# Patient Record
Sex: Female | Born: 1940 | Race: White | Hispanic: No | Marital: Married | Smoking: Never smoker
Health system: Southern US, Community
[De-identification: ages and names within clinical notes are randomized; demographics above are authoritative.]

## PROBLEM LIST (undated history)

## (undated) DIAGNOSIS — J45909 Unspecified asthma, uncomplicated: Secondary | ICD-10-CM

## (undated) DIAGNOSIS — I10 Essential (primary) hypertension: Secondary | ICD-10-CM

## (undated) DIAGNOSIS — E785 Hyperlipidemia, unspecified: Secondary | ICD-10-CM

## (undated) DIAGNOSIS — K219 Gastro-esophageal reflux disease without esophagitis: Secondary | ICD-10-CM

## (undated) HISTORY — DX: Essential (primary) hypertension: I10

## (undated) HISTORY — DX: Hyperlipidemia, unspecified: E78.5

## (undated) HISTORY — PX: TONSILLECTOMY: SUR1361

## (undated) HISTORY — DX: Unspecified asthma, uncomplicated: J45.909

## (undated) HISTORY — PX: ABDOMINAL HYSTERECTOMY: SHX81

## (undated) HISTORY — DX: Gastro-esophageal reflux disease without esophagitis: K21.9

## (undated) HISTORY — PX: DG GALL BLADDER: HXRAD326

---

## 2015-10-19 ENCOUNTER — Telehealth: Payer: Self-pay | Admitting: Nurse Practitioner

## 2015-10-19 NOTE — Telephone Encounter (Signed)
Appt given per patients request 

## 2015-10-25 ENCOUNTER — Encounter: Payer: Self-pay | Admitting: Nurse Practitioner

## 2015-10-25 ENCOUNTER — Ambulatory Visit (INDEPENDENT_AMBULATORY_CARE_PROVIDER_SITE_OTHER): Payer: Medicare Other | Admitting: Nurse Practitioner

## 2015-10-25 VITALS — BP 118/70 | HR 69 | Temp 97.6°F | Ht 60.0 in | Wt 271.0 lb

## 2015-10-25 DIAGNOSIS — J4520 Mild intermittent asthma, uncomplicated: Secondary | ICD-10-CM | POA: Diagnosis not present

## 2015-10-25 DIAGNOSIS — E785 Hyperlipidemia, unspecified: Secondary | ICD-10-CM

## 2015-10-25 DIAGNOSIS — I2584 Coronary atherosclerosis due to calcified coronary lesion: Secondary | ICD-10-CM

## 2015-10-25 DIAGNOSIS — I10 Essential (primary) hypertension: Secondary | ICD-10-CM | POA: Insufficient documentation

## 2015-10-25 DIAGNOSIS — M159 Polyosteoarthritis, unspecified: Secondary | ICD-10-CM

## 2015-10-25 DIAGNOSIS — K219 Gastro-esophageal reflux disease without esophagitis: Secondary | ICD-10-CM | POA: Insufficient documentation

## 2015-10-25 DIAGNOSIS — M150 Primary generalized (osteo)arthritis: Secondary | ICD-10-CM

## 2015-10-25 DIAGNOSIS — M810 Age-related osteoporosis without current pathological fracture: Secondary | ICD-10-CM | POA: Diagnosis not present

## 2015-10-25 DIAGNOSIS — D649 Anemia, unspecified: Secondary | ICD-10-CM | POA: Diagnosis not present

## 2015-10-25 DIAGNOSIS — L03115 Cellulitis of right lower limb: Secondary | ICD-10-CM

## 2015-10-25 DIAGNOSIS — G609 Hereditary and idiopathic neuropathy, unspecified: Secondary | ICD-10-CM | POA: Diagnosis not present

## 2015-10-25 DIAGNOSIS — I2510 Atherosclerotic heart disease of native coronary artery without angina pectoris: Secondary | ICD-10-CM | POA: Diagnosis not present

## 2015-10-25 MED ORDER — CIPROFLOXACIN HCL 500 MG PO TABS: 500.0000 mg | ORAL_TABLET | Freq: Two times a day (BID) | ORAL | 0 refills | Status: DC

## 2015-10-25 MED ORDER — GABAPENTIN 300 MG PO CAPS: 300.0000 mg | ORAL_CAPSULE | Freq: Every day | ORAL | 3 refills | Status: DC

## 2015-10-25 NOTE — Addendum Note (Signed)
Addended by: Bennie PieriniMARTIN, MARY-MARGARET on: 10/25/2015 11:44 AM   Modules accepted: Orders

## 2015-10-25 NOTE — Progress Notes (Addendum)
Subjective:    Patient ID: Maureen Long, female    DOB: 06-27-1940, 75 y.o.   MRN: [id number]  Patient here today to establish care.  Outpatient Encounter Prescriptions as of 10/25/2015  Medication Sig  . albuterol (PROVENTIL HFA;VENTOLIN HFA) 108 (90 Base) MCG/ACT inhaler Inhale 2 puffs into the lungs every 4 (four) hours as needed for wheezing or shortness of breath.  Marland Kitchen. alendronate (FOSAMAX) 35 MG tablet   . aspirin EC 81 MG tablet Take 81 mg by mouth daily.  . beclomethasone (QVAR) 80 MCG/ACT inhaler Inhale 2 puffs into the lungs 2 (two) times daily.  . celecoxib (CELEBREX) 200 MG capsule Take 200 mg by mouth 2 (two) times daily.  . ferrous sulfate 325 (65 FE) MG tablet Take 325 mg by mouth daily with breakfast.  . irbesartan (AVAPRO) 150 MG tablet Take 150 mg by mouth daily.  . isosorbide dinitrate (ISORDIL) 30 MG tablet Take 30 mg by mouth daily.  . montelukast (SINGULAIR) 10 MG tablet Take 10 mg by mouth at bedtime.  . Multiple Vitamin (MULTIVITAMIN WITH MINERALS) TABS tablet Take 1 tablet by mouth daily.  . Omega-3 Fatty Acids (FISH OIL) 1000 MG CAPS Take by mouth.  Marland Kitchen. omeprazole (PRILOSEC) 40 MG capsule   . rosuvastatin (CRESTOR) 40 MG tablet Take 40 mg by mouth daily.  Marland Kitchen. triamterene-hydrochlorothiazide (MAXZIDE-25) 37.5-25 MG tablet Take 1 tablet by mouth daily. Take 1/2 tablet daily as needed  . albuterol (PROVENTIL) (2.5 MG/3ML) 0.083% nebulizer solution Take 2.5 mg by nebulization every 6 (six) hours as needed for wheezing or shortness of breath.  Marland Kitchen. ipratropium (ATROVENT) 0.02 % nebulizer solution Take 0.5 mg by nebulization every 6 (six) hours as needed for wheezing or shortness of breath.   No facility-administered encounter medications on file as of 10/25/2015.     Hypertension  This is a chronic problem. The current episode started more than 1 year ago. The problem is unchanged. The problem is controlled. Associated symptoms include peripheral edema and shortness of breath  (on occasion). Pertinent negatives include no headaches or neck pain. Risk factors for coronary artery disease include obesity, post-menopausal state, sedentary lifestyle and dyslipidemia. Past treatments include angiotensin blockers. The current treatment provides moderate improvement. Compliance problems include diet.  Hypertensive end-organ damage includes CAD/MI.  Hyperlipidemia  This is a chronic problem. The current episode started more than 1 year ago. The problem is controlled. Recent lipid tests were reviewed and are variable. Exacerbating diseases include obesity. She has no history of diabetes or hypothyroidism. Associated symptoms include shortness of breath (on occasion). Current antihyperlipidemic treatment includes statins. The current treatment provides mild improvement of lipids. Compliance problems include adherence to diet and adherence to exercise.  Risk factors for coronary artery disease include dyslipidemia, obesity and post-menopausal.  CAD Sees cardiologist- Dr. Gwenlyn Long- doing well- is currently on issosorbide- she denies any chest pain but does have occasional SOB- sees cardiology yearly. GERD Omeprazole for gastric reflux- works well to keep symptoms under control Anemia Ferrous sulfate daily- always fatigue Asthma Currently on qvar daily and uses albuterol only as needed. Sees pulmonologist yearly. Still has occasional SOB. osteoporosis Fosamax weekly- no side effects- no c/o back pain osteoarthritis All over- says different joints flare up at different times- takes celebrex daily- seems to really help.  * Patient had a lesion burned offof her right lower leg last week and area is red and sore. * Patient has numbness in tingling in legs and feet- this has been going on  for awhile. She has frequent flare ups of sciatica.  HAD BLOOD WORK DONE 1 MONTH AGO- WAS ALL GOOD EXCEPT BLOOD WORK WAS ELEVATED.  Review of Systems  Constitutional: Negative.   HENT: Negative.     Respiratory: Positive for shortness of breath (on occasion).   Cardiovascular: Negative.   Gastrointestinal: Negative.   Genitourinary: Negative.   Musculoskeletal: Negative for neck pain.  Neurological: Negative.  Negative for headaches.  Psychiatric/Behavioral: Negative.   All other systems reviewed and are negative.      Objective:   Physical Exam  Constitutional: She is oriented to person, place, and time. She appears well-developed and well-nourished.  HENT:  Nose: Nose normal.  Mouth/Throat: Oropharynx is clear and moist.  Eyes: EOM are normal.  Neck: Trachea normal, normal range of motion and full passive range of motion without pain. Neck supple. No JVD present. Carotid bruit is not present. No thyromegaly present.  Cardiovascular: Normal rate, regular rhythm and intact distal pulses.  Exam reveals no gallop and no friction rub.   Murmur (2/6 systolic murmur) heard. Pulmonary/Chest: Effort normal and breath sounds normal.  Abdominal: Soft. Bowel sounds are normal. She exhibits no distension and no mass. There is no tenderness.  Musculoskeletal: Normal range of motion.  Lymphadenopathy:    She has no cervical adenopathy.  Neurological: She is alert and oriented to person, place, and time. She has normal reflexes.  Skin: Skin is warm and dry.  3CM ANNULAR RAW MOIST AREA WITH SURROUNDING ERYTHEMA RIGHT LOWER LEG  Psychiatric: She has a normal mood and affect. Her behavior is normal. Judgment and thought content normal.    BP 118/70   Pulse 69   Temp 97.6 F (36.4 C) (Oral)   Ht 5' (1.524 m)   Wt 271 lb (122.9 kg)   BMI 52.93 kg/m       Assessment & Plan:  1. Essential hypertension Do not add salt to diet  2. Hyperlipidemia Low fat diet  3. Gastroesophageal reflux disease without esophagitis Avoid spicy foods Do not eat 2 hours prior to bedtime  4. Coronary artery disease due to calcified coronary lesion follow up with cardiology  5. Osteoporosis Weight  bearing exercises as tolerated  6. Asthma, mild intermittent, uncomplicated Keep follow up with pulmonology  7. Anemia, unspecified anemia type  8. Morbid obesity, unspecified obesity type (HCC) Discussed diet and exercise for person with BMI >25 Will recheck weight in 3-6 months  9. Primary osteoarthritis involving multiple joints  10. Hereditary and idiopathic peripheral neuropathy Do not go barfooted - gabapentin (NEURONTIN) 300 MG capsule; Take 1 capsule (300 mg total) by mouth at bedtime.  Dispense: 30 capsule; Refill: 3  11. Cellulitis of leg, right Wound care discussed - ciprofloxacin (CIPRO) 500 MG tablet; Take 1 tablet (500 mg total) by mouth 2 (two) times daily.  Dispense: 20 tablet; Refill: 0    Labs not needed at this visit Health maintenance reviewed Diet and exercise encouraged Continue all meds Follow up  In 6 months Mary-Margaret Daphine DeutscherMartin, FNP

## 2015-10-25 NOTE — Patient Instructions (Signed)
Wound Care °Taking care of your wound properly can help to prevent pain and infection. It can also help your wound to heal more quickly.  °HOW TO CARE FOR YOUR WOUND  °· Take or apply over-the-counter and prescription medicines only as told by your health care provider. °· If you were prescribed antibiotic medicine, take or apply it as told by your health care provider. Do not stop using the antibiotic even if your condition improves. °· Clean the wound each day or as told by your health care provider. °¨ Wash the wound with mild soap and water. °¨ Rinse the wound with water to remove all soap. °¨ Pat the wound dry with a clean towel. Do not rub it. °· There are many different ways to close and cover a wound. For example, a wound can be covered with stitches (sutures), skin glue, or adhesive strips. Follow instructions from your health care provider about: °¨ How to take care of your wound. °¨ When and how you should change your bandage (dressing). °¨ When you should remove your dressing. °¨ Removing whatever was used to close your wound. °· Check your wound every day for signs of infection. Watch for: °¨ Redness, swelling, or pain. °¨ Fluid, blood, or pus. °· Keep the dressing dry until your health care provider says it can be removed. Do not take baths, swim, use a hot tub, or do anything that would put your wound underwater until your health care provider approves. °· Raise (elevate) the injured area above the level of your heart while you are sitting or lying down. °· Do not scratch or pick at the wound. °· Keep all follow-up visits as told by your health care provider. This is important. °SEEK MEDICAL CARE IF: °· You received a tetanus shot and you have swelling, severe pain, redness, or bleeding at the injection site. °· You have a fever. °· Your pain is not controlled with medicine. °· You have increased redness, swelling, or pain at the site of your wound. °· You have fluid, blood, or pus coming from your  wound. °· You notice a bad smell coming from your wound or your dressing. °SEEK IMMEDIATE MEDICAL CARE IF: °· You have a red streak going away from your wound. °  °This information is not intended to replace advice given to you by your health care provider. Make sure you discuss any questions you have with your health care provider. °  °Document Released: 12/04/2007 Document Revised: 07/11/2014 Document Reviewed: 02/20/2014 °Elsevier Interactive Patient Education ©2016 Elsevier Inc. ° °

## 2015-10-25 NOTE — Addendum Note (Signed)
Addended by: Bennie PieriniMARTIN, MARY-MARGARET on: 10/25/2015 11:43 AM   Modules accepted: Orders

## 2015-10-25 NOTE — Addendum Note (Signed)
Addended by: Bennie PieriniMARTIN, MARY-MARGARET on: 10/25/2015 11:48 AM   Modules accepted: Orders

## 2016-01-03 ENCOUNTER — Other Ambulatory Visit: Payer: Self-pay | Admitting: Nurse Practitioner

## 2016-01-03 DIAGNOSIS — G609 Hereditary and idiopathic neuropathy, unspecified: Secondary | ICD-10-CM

## 2016-01-07 ENCOUNTER — Other Ambulatory Visit: Payer: Self-pay | Admitting: Nurse Practitioner

## 2016-01-07 DIAGNOSIS — L03115 Cellulitis of right lower limb: Secondary | ICD-10-CM

## 2016-01-07 MED ORDER — CIPROFLOXACIN HCL 500 MG PO TABS: 500.0000 mg | ORAL_TABLET | Freq: Two times a day (BID) | ORAL | 0 refills | Status: DC

## 2016-01-29 ENCOUNTER — Other Ambulatory Visit: Payer: Self-pay | Admitting: Nurse Practitioner

## 2016-01-29 DIAGNOSIS — G609 Hereditary and idiopathic neuropathy, unspecified: Secondary | ICD-10-CM

## 2016-01-29 MED ORDER — IRBESARTAN 150 MG PO TABS: 150.0000 mg | ORAL_TABLET | Freq: Every day | ORAL | 0 refills | Status: DC

## 2016-01-29 MED ORDER — GABAPENTIN 300 MG PO CAPS: 300.0000 mg | ORAL_CAPSULE | Freq: Every day | ORAL | 0 refills | Status: DC

## 2016-01-29 MED ORDER — ALENDRONATE SODIUM 35 MG PO TABS: 35.0000 mg | ORAL_TABLET | ORAL | 0 refills | Status: DC

## 2016-01-29 NOTE — Telephone Encounter (Signed)
done

## 2016-03-14 ENCOUNTER — Other Ambulatory Visit: Payer: Self-pay | Admitting: Nurse Practitioner

## 2016-03-14 DIAGNOSIS — G609 Hereditary and idiopathic neuropathy, unspecified: Secondary | ICD-10-CM

## 2016-03-14 MED ORDER — ISOSORBIDE DINITRATE 30 MG PO TABS
30.0000 mg | ORAL_TABLET | Freq: Every day | ORAL | 1 refills | Status: DC
Start: 2016-03-14 — End: 2016-11-06

## 2016-03-14 MED ORDER — ALENDRONATE SODIUM 35 MG PO TABS: 35.0000 mg | ORAL_TABLET | ORAL | 1 refills | Status: DC

## 2016-03-14 MED ORDER — GABAPENTIN 300 MG PO CAPS: 300.0000 mg | ORAL_CAPSULE | Freq: Every day | ORAL | 1 refills | Status: DC

## 2016-03-14 NOTE — Telephone Encounter (Signed)
Patient aware.

## 2016-03-14 NOTE — Telephone Encounter (Signed)
All rx sent to [facility] and gabapentin was increased to TID

## 2016-03-18 ENCOUNTER — Other Ambulatory Visit: Payer: Self-pay | Admitting: Nurse Practitioner

## 2016-03-18 MED ORDER — GABAPENTIN 300 MG PO CAPS: 300.0000 mg | ORAL_CAPSULE | Freq: Three times a day (TID) | ORAL | 1 refills | Status: DC

## 2016-03-18 NOTE — Telephone Encounter (Signed)
Last refill without being seen 

## 2016-05-06 ENCOUNTER — Other Ambulatory Visit: Payer: Self-pay | Admitting: Nurse Practitioner

## 2016-05-06 ENCOUNTER — Ambulatory Visit (INDEPENDENT_AMBULATORY_CARE_PROVIDER_SITE_OTHER): Payer: Medicare Other | Admitting: Nurse Practitioner

## 2016-05-06 ENCOUNTER — Encounter: Payer: Self-pay | Admitting: Nurse Practitioner

## 2016-05-06 VITALS — BP 151/95 | HR 72 | Temp 97.5°F | Ht 60.0 in | Wt 272.0 lb

## 2016-05-06 DIAGNOSIS — G609 Hereditary and idiopathic neuropathy, unspecified: Secondary | ICD-10-CM

## 2016-05-06 DIAGNOSIS — J4520 Mild intermittent asthma, uncomplicated: Secondary | ICD-10-CM | POA: Diagnosis not present

## 2016-05-06 DIAGNOSIS — K219 Gastro-esophageal reflux disease without esophagitis: Secondary | ICD-10-CM

## 2016-05-06 DIAGNOSIS — Z23 Encounter for immunization: Secondary | ICD-10-CM

## 2016-05-06 DIAGNOSIS — I2510 Atherosclerotic heart disease of native coronary artery without angina pectoris: Secondary | ICD-10-CM

## 2016-05-06 DIAGNOSIS — I2584 Coronary atherosclerosis due to calcified coronary lesion: Secondary | ICD-10-CM | POA: Diagnosis not present

## 2016-05-06 DIAGNOSIS — Z1211 Encounter for screening for malignant neoplasm of colon: Secondary | ICD-10-CM

## 2016-05-06 DIAGNOSIS — I10 Essential (primary) hypertension: Secondary | ICD-10-CM | POA: Diagnosis not present

## 2016-05-06 DIAGNOSIS — E7800 Pure hypercholesterolemia, unspecified: Secondary | ICD-10-CM | POA: Diagnosis not present

## 2016-05-06 DIAGNOSIS — D649 Anemia, unspecified: Secondary | ICD-10-CM

## 2016-05-06 DIAGNOSIS — M810 Age-related osteoporosis without current pathological fracture: Secondary | ICD-10-CM | POA: Diagnosis not present

## 2016-05-06 DIAGNOSIS — Z1231 Encounter for screening mammogram for malignant neoplasm of breast: Secondary | ICD-10-CM

## 2016-05-06 MED ORDER — HUGO ROLLING WALKER ELITE MISC: 0 refills | Status: AC

## 2016-05-06 MED ORDER — FERROUS SULFATE 325 (65 FE) MG PO TABS: 325.0000 mg | ORAL_TABLET | Freq: Every day | ORAL | 1 refills | Status: DC

## 2016-05-06 MED ORDER — CELECOXIB 200 MG PO CAPS: 200.0000 mg | ORAL_CAPSULE | Freq: Two times a day (BID) | ORAL | 5 refills | Status: DC

## 2016-05-06 MED ORDER — GABAPENTIN 300 MG PO CAPS: 300.0000 mg | ORAL_CAPSULE | Freq: Three times a day (TID) | ORAL | 1 refills | Status: DC

## 2016-05-06 MED ORDER — CLOBETASOL PROPIONATE 0.05 % EX CREA: 1.0000 "application " | TOPICAL_CREAM | Freq: Two times a day (BID) | CUTANEOUS | 3 refills | Status: AC

## 2016-05-06 MED ORDER — TRIAMTERENE-HCTZ 37.5-25 MG PO TABS: 1.0000 | ORAL_TABLET | Freq: Every day | ORAL | 1 refills | Status: DC

## 2016-05-06 MED ORDER — ALENDRONATE SODIUM 35 MG PO TABS: 35.0000 mg | ORAL_TABLET | ORAL | 1 refills | Status: DC

## 2016-05-06 MED ORDER — IRBESARTAN 150 MG PO TABS: 150.0000 mg | ORAL_TABLET | Freq: Every day | ORAL | 1 refills | Status: DC

## 2016-05-06 MED ORDER — OMEPRAZOLE 40 MG PO CPDR: 40.0000 mg | DELAYED_RELEASE_CAPSULE | Freq: Every day | ORAL | 1 refills | Status: DC

## 2016-05-06 MED ORDER — ROSUVASTATIN CALCIUM 40 MG PO TABS: 40.0000 mg | ORAL_TABLET | Freq: Every day | ORAL | 1 refills | Status: DC

## 2016-05-06 NOTE — Progress Notes (Signed)
Subjective:    Patient ID: Maureen Long, female    DOB: 1940/06/28, 76 y.o.   MRN: [id number]  Patient here today for follow up. C/O sciatica worsening.  Outpatient Encounter Prescriptions as of 05/06/2016  Medication Sig  . albuterol (PROVENTIL HFA;VENTOLIN HFA) 108 (90 Base) MCG/ACT inhaler Inhale 2 puffs into the lungs every 4 (four) hours as needed for wheezing or shortness of breath.  Marland Kitchen albuterol (PROVENTIL) (2.5 MG/3ML) 0.083% nebulizer solution Take 2.5 mg by nebulization every 6 (six) hours as needed for wheezing or shortness of breath.  Marland Kitchen alendronate (FOSAMAX) 35 MG tablet Take 1 tablet (35 mg total) by mouth every 7 (seven) days. Take with a full glass of water on an empty stomach.  Marland Kitchen aspirin EC 81 MG tablet Take 81 mg by mouth daily.  . beclomethasone (QVAR) 80 MCG/ACT inhaler Inhale 2 puffs into the lungs 2 (two) times daily.  . celecoxib (CELEBREX) 200 MG capsule Take 200 mg by mouth 2 (two) times daily.  . clobetasol cream (TEMOVATE) 9.15 % Apply 1 application topically 2 (two) times daily.  . ferrous sulfate 325 (65 FE) MG tablet Take 325 mg by mouth daily with breakfast.  . gabapentin (NEURONTIN) 300 MG capsule Take 1 capsule (300 mg total) by mouth 3 (three) times daily.  Marland Kitchen HYDROcodone-acetaminophen (NORCO/VICODIN) 5-325 MG tablet   . ipratropium (ATROVENT) 0.02 % nebulizer solution Take 0.5 mg by nebulization every 6 (six) hours as needed for wheezing or shortness of breath.  . irbesartan (AVAPRO) 150 MG tablet TAKE 1 TABLET BY MOUTH  DAILY  . isosorbide dinitrate (ISORDIL) 30 MG tablet Take 1 tablet (30 mg total) by mouth daily.  . montelukast (SINGULAIR) 10 MG tablet Take 10 mg by mouth at bedtime.  . Multiple Vitamin (MULTIVITAMIN WITH MINERALS) TABS tablet Take 1 tablet by mouth daily.  . Omega-3 Fatty Acids (FISH OIL) 1000 MG CAPS Take by mouth.  Marland Kitchen omeprazole (PRILOSEC) 40 MG capsule   . rosuvastatin (CRESTOR) 40 MG tablet Take 40 mg by mouth daily.  Marland Kitchen  triamterene-hydrochlorothiazide (MAXZIDE-25) 37.5-25 MG tablet Take 1 tablet by mouth daily. Take 1/2 tablet daily as needed   No facility-administered encounter medications on file as of 05/06/2016.     Hypertension  This is a chronic problem. The current episode started more than 1 year ago. The problem is unchanged. The problem is controlled. Associated symptoms include peripheral edema and shortness of breath (on occasion). Pertinent negatives include no headaches or neck pain. Risk factors for coronary artery disease include obesity, post-menopausal state, sedentary lifestyle and dyslipidemia. Past treatments include angiotensin blockers. The current treatment provides moderate improvement. Compliance problems include diet.  Hypertensive end-organ damage includes CAD/MI.  Hyperlipidemia  This is a chronic problem. The current episode started more than 1 year ago. The problem is controlled. Recent lipid tests were reviewed and are variable. Exacerbating diseases include obesity. She has no history of diabetes or hypothyroidism. Associated symptoms include shortness of breath (on occasion). Current antihyperlipidemic treatment includes statins. The current treatment provides mild improvement of lipids. Compliance problems include adherence to diet and adherence to exercise.  Risk factors for coronary artery disease include dyslipidemia, obesity and post-menopausal.  CAD Sees cardiologist- Dr. Verner Chol- doing well- is currently on issosorbide- she denies any chest pain but does have occasional SOB- sees cardiology yearly. GERD Omeprazole for gastric reflux- works well to keep symptoms under control Anemia Ferrous sulfate daily- always fatigue Asthma Currently on qvar daily and uses albuterol only as  needed. Sees pulmonologist yearly. Still has occasional SOB. osteoporosis Fosamax weekly- no side effects- no c/o back pain osteoarthritis All over- says different joints flare up at different  times- takes celebrex daily- seems to really help. sciatica Patient currently having lotsof problems with sciatica- she has been getting back injections which has helped some butr she says she hurts all the time- the more she gets up and walks the worsethe pain gets. SHe would like to try some physical therapy.     Review of Systems  Constitutional: Negative.   HENT: Negative.   Respiratory: Positive for shortness of breath (on occasion).   Cardiovascular: Negative.   Gastrointestinal: Negative.   Genitourinary: Negative.   Musculoskeletal: Negative for neck pain.  Neurological: Negative.  Negative for headaches.  Psychiatric/Behavioral: Negative.   All other systems reviewed and are negative.      Objective:   Physical Exam  Constitutional: She is oriented to person, place, and time. She appears well-developed and well-nourished.  HENT:  Nose: Nose normal.  Mouth/Throat: Oropharynx is clear and moist.  Eyes: EOM are normal.  Neck: Trachea normal, normal range of motion and full passive range of motion without pain. Neck supple. No JVD present. Carotid bruit is not present. No thyromegaly present.  Cardiovascular: Normal rate, regular rhythm and intact distal pulses.  Exam reveals no gallop and no friction rub.   Murmur (2/6 systolic murmur) heard. Pulmonary/Chest: Effort normal and breath sounds normal.  Abdominal: Soft. Bowel sounds are normal. She exhibits no distension and no mass. There is no tenderness.  Musculoskeletal: Normal range of motion.  Lymphadenopathy:    She has no cervical adenopathy.  Neurological: She is alert and oriented to person, place, and time. She has normal reflexes.  Skin: Skin is warm and dry.  Psychiatric: She has a normal mood and affect. Her behavior is normal. Judgment and thought content normal.    BP (!) 151/95   Pulse 72   Temp 97.5 F (36.4 C) (Oral)   Ht 5' (1.524 m)   Wt 272 lb (123.4 kg)   BMI 53.12 kg/m       Assessment &  Plan:  1. Coronary artery disease due to calcified coronary lesion  2. Essential hypertension, malignant Low sodium diet - triamterene-hydrochlorothiazide (MAXZIDE-25) 37.5-25 MG tablet; Take 1 tablet by mouth daily. Take 1/2 tablet daily as needed  Dispense: 90 tablet; Refill: 1 - irbesartan (AVAPRO) 150 MG tablet; Take 1 tablet (150 mg total) by mouth daily.  Dispense: 90 tablet; Refill: 1 - CMP14+EGFR - VITAMIN D 25 Hydroxy (Vit-D Deficiency, Fractures)  3. Mild intermittent asthma without complication  4. Gastroesophageal reflux disease without esophagitis Avoid spicy foods Do not eat 2 hours prior to bedtime - omeprazole (PRILOSEC) 40 MG capsule; Take 1 capsule (40 mg total) by mouth daily.  Dispense: 90 capsule; Refill: 1  5. Age-related osteoporosis without current pathological fracture Weight bearing exercise if can tolerate - alendronate (FOSAMAX) 35 MG tablet; Take 1 tablet (35 mg total) by mouth every 7 (seven) days. Take with a full glass of water on an empty stomach.  Dispense: 12 tablet; Refill: 1  6. Anemia, unspecified type - ferrous sulfate 325 (65 FE) MG tablet; Take 1 tablet (325 mg total) by mouth daily with breakfast.  Dispense: 90 tablet; Refill: 1 - Anemia Profile B  7. Pure hypercholesterolemia Low fat diet - rosuvastatin (CRESTOR) 40 MG tablet; Take 1 tablet (40 mg total) by mouth daily.  Dispense: 90 tablet; Refill: 1 -  Lipid panel  8. Morbid obesity ([place]) Discussed diet and exercise for person with BMI >25 Will recheck weight in 3-6 months  9. Hexreditary and idiopathic peripheral neuropathy rx written for physicaltherapy - gabapentin (NEURONTIN) 300 MG capsule; Take 1 capsule (300 mg total) by mouth 3 (three) times daily.  Dispense: 540 capsule; Refill: 1 - celecoxib (CELEBREX) 200 MG capsule; Take 1 capsule (200 mg total) by mouth 2 (two) times daily.  Dispense: 60 capsule; Refill: 5  10. Screening mammogram, encounter for - MM Digital Screening;  Future    Labs pending Health maintenance reviewed Diet and exercise encouraged Continue all meds Follow up  In 6 months   [place], FNP

## 2016-05-06 NOTE — Addendum Note (Signed)
Addended by: Bennie PieriniMARTIN, MARY-MARGARET on: 05/06/2016 12:28 PM   Modules accepted: Orders

## 2016-05-06 NOTE — Patient Instructions (Signed)
Sciatica Sciatica is pain, numbness, weakness, or tingling along your sciatic nerve. The sciatic nerve starts in the lower back and goes down the back of each leg. Sciatica happens when this nerve is pinched or has pressure put on it. Sciatica usually goes away on its own or with treatment. Sometimes, sciatica may keep coming back (recur). Follow these instructions at home: Medicines   Take over-the-counter and prescription medicines only as told by your doctor.  Do not drive or use heavy machinery while taking prescription pain medicine. Managing pain   If directed, put ice on the affected area.  Put ice in a plastic bag.  Place a towel between your skin and the bag.  Leave the ice on for 20 minutes, 2-3 times a day.  After icing, apply heat to the affected area before you exercise or as often as told by your doctor. Use the heat source that your doctor tells you to use, such as a moist heat pack or a heating pad.  Place a towel between your skin and the heat source.  Leave the heat on for 20-30 minutes.  Remove the heat if your skin turns bright red. This is especially important if you are unable to feel pain, heat, or cold. You may have a greater risk of getting burned. Activity   Return to your normal activities as told by your doctor. Ask your doctor what activities are safe for you.  Avoid activities that make your sciatica worse.  Take short rests during the day. Rest in a lying or standing position. This is usually better than sitting to rest.  When you rest for a long time, do some physical activity or stretching between periods of rest.  Avoid sitting for a long time without moving. Get up and move around at least one time each hour.  Exercise and stretch regularly, as told by your doctor.  Do not lift anything that is heavier than 10 lb (4.5 kg) while you have symptoms of sciatica.  Avoid lifting heavy things even when you do not have symptoms.  Avoid lifting  heavy things over and over.  When you lift objects, always lift in a way that is safe for your body. To do this, you should:  Bend your knees.  Keep the object close to your body.  Avoid twisting. General instructions   Use good posture.  Avoid leaning forward when you are sitting.  Avoid hunching over when you are standing.  Stay at a healthy weight.  Wear comfortable shoes that support your feet. Avoid wearing high heels.  Avoid sleeping on a mattress that is too soft or too hard. You might have less pain if you sleep on a mattress that is firm enough to support your back.  Keep all follow-up visits as told by your doctor. This is important. Contact a doctor if:  You have pain that:  Wakes you up when you are sleeping.  Gets worse when you lie down.  Is worse than the pain you have had in the past.  Lasts longer than 4 weeks.  You lose weight for without trying. Get help right away if:  You cannot control when you pee (urinate) or poop (have a bowel movement).  You have weakness in any of these areas and it gets worse.  Lower back.  Lower belly (pelvis).  Butt (buttocks).  Legs.  You have redness or swelling of your back.  You have a burning feeling when you pee. This information is   not intended to replace advice given to you by your health care provider. Make sure you discuss any questions you have with your health care provider. Document Released: 12/04/2007 Document Revised: 08/02/2015 Document Reviewed: 11/03/2014 Elsevier Interactive Patient Education  2017 Elsevier Inc.  

## 2016-05-06 NOTE — Addendum Note (Signed)
Addended by: Cleda DaubUCKER, Patte Winkel G on: 05/06/2016 12:53 PM   Modules accepted: Orders

## 2016-05-07 ENCOUNTER — Telehealth: Payer: Self-pay | Admitting: Nurse Practitioner

## 2016-05-07 ENCOUNTER — Other Ambulatory Visit: Payer: Self-pay | Admitting: *Deleted

## 2016-05-07 ENCOUNTER — Other Ambulatory Visit: Payer: Self-pay | Admitting: Nurse Practitioner

## 2016-05-07 DIAGNOSIS — G609 Hereditary and idiopathic neuropathy, unspecified: Secondary | ICD-10-CM

## 2016-05-07 LAB — ANEMIA PROFILE B
Basophils Absolute: 0.1 10*3/uL (ref 0.0–0.2)
Basos: 1 %
EOS (ABSOLUTE): 0.2 10*3/uL (ref 0.0–0.4)
EOS: 3 %
Ferritin: 19 ng/mL (ref 15–150)
Folate: 16.6 ng/mL (ref >3.0)
HEMATOCRIT: 38 % (ref 34.0–46.6)
HEMOGLOBIN: 12 g/dL (ref 11.1–15.9)
IMMATURE GRANS (ABS): 0 10*3/uL (ref 0.0–0.1)
IMMATURE GRANULOCYTES: 0 %
IRON SATURATION: 9 % — AB (ref 15–55)
IRON: 36 ug/dL (ref 27–139)
LYMPHS ABS: 1.5 10*3/uL (ref 0.7–3.1)
Lymphs: 18 %
MCH: 26.9 pg (ref 26.6–33.0)
MCHC: 31.6 g/dL (ref 31.5–35.7)
MCV: 85 fL (ref 79–97)
Monocytes Absolute: 0.8 10*3/uL (ref 0.1–0.9)
Monocytes: 9 %
NEUTROS PCT: 69 %
Neutrophils Absolute: 5.9 10*3/uL (ref 1.4–7.0)
Platelets: 399 10*3/uL — ABNORMAL HIGH (ref 150–379)
RBC: 4.46 x10E6/uL (ref 3.77–5.28)
RDW: 14.4 % (ref 12.3–15.4)
Retic Ct Pct: 1.8 % (ref 0.6–2.6)
Total Iron Binding Capacity: 388 ug/dL (ref 250–450)
UIBC: 352 ug/dL (ref 118–369)
Vitamin B-12: >2000 pg/mL — ABNORMAL HIGH (ref 232–1245)
WBC: 8.6 10*3/uL (ref 3.4–10.8)

## 2016-05-07 LAB — CMP14+EGFR
A/G RATIO: 1.8 (ref 1.2–2.2)
ALBUMIN: 4.1 g/dL (ref 3.5–4.8)
ALK PHOS: 67 IU/L (ref 39–117)
ALT: 16 IU/L (ref 0–32)
AST: 17 IU/L (ref 0–40)
BUN/Creatinine Ratio: 34 — ABNORMAL HIGH (ref 12–28)
BUN: 22 mg/dL (ref 8–27)
Bilirubin Total: 0.3 mg/dL (ref 0.0–1.2)
CO2: 26 mmol/L (ref 18–29)
CREATININE: 0.64 mg/dL (ref 0.57–1.00)
Calcium: 9.2 mg/dL (ref 8.7–10.3)
Chloride: 95 mmol/L — ABNORMAL LOW (ref 96–106)
GFR calc Af Amer: 101 mL/min/{1.73_m2} (ref >59)
GFR calc non Af Amer: 88 mL/min/{1.73_m2} (ref >59)
GLOBULIN, TOTAL: 2.3 g/dL (ref 1.5–4.5)
Glucose: 103 mg/dL — ABNORMAL HIGH (ref 65–99)
POTASSIUM: 5.1 mmol/L (ref 3.5–5.2)
SODIUM: 137 mmol/L (ref 134–144)
Total Protein: 6.4 g/dL (ref 6.0–8.5)

## 2016-05-07 LAB — LIPID PANEL
CHOLESTEROL TOTAL: 239 mg/dL — AB (ref 100–199)
Chol/HDL Ratio: 3.7 ratio units (ref 0.0–4.4)
HDL: 65 mg/dL (ref >39)
LDL CALC: 151 mg/dL — AB (ref 0–99)
Triglycerides: 114 mg/dL (ref 0–149)
VLDL Cholesterol Cal: 23 mg/dL (ref 5–40)

## 2016-05-07 LAB — VITAMIN D 25 HYDROXY (VIT D DEFICIENCY, FRACTURES): Vit D, 25-Hydroxy: 30 ng/mL (ref 30.0–100.0)

## 2016-05-07 MED ORDER — GABAPENTIN 300 MG PO CAPS: 600.0000 mg | ORAL_CAPSULE | Freq: Three times a day (TID) | ORAL | 1 refills | Status: DC

## 2016-05-07 NOTE — Telephone Encounter (Signed)
Pt seen yesterday and prescription was sent in wrong for  gabapentin 1 capsule 3 x a day and she is taking 2 capsules 3x a day. [facility] [place] [facility]

## 2016-05-07 NOTE — Telephone Encounter (Signed)
Please advise on changes to gabapentin dosing.

## 2016-05-07 NOTE — Telephone Encounter (Signed)
RX fixed.

## 2016-05-08 ENCOUNTER — Telehealth: Payer: Self-pay | Admitting: Nurse Practitioner

## 2016-05-08 ENCOUNTER — Other Ambulatory Visit: Payer: Self-pay | Admitting: Nurse Practitioner

## 2016-05-08 DIAGNOSIS — I10 Essential (primary) hypertension: Secondary | ICD-10-CM

## 2016-05-08 DIAGNOSIS — G609 Hereditary and idiopathic neuropathy, unspecified: Secondary | ICD-10-CM

## 2016-05-08 MED ORDER — GABAPENTIN 300 MG PO CAPS: 600.0000 mg | ORAL_CAPSULE | Freq: Three times a day (TID) | ORAL | 1 refills | Status: DC

## 2016-05-08 MED ORDER — CELECOXIB 200 MG PO CAPS: 200.0000 mg | ORAL_CAPSULE | Freq: Two times a day (BID) | ORAL | 1 refills | Status: DC

## 2016-05-08 MED ORDER — TRIAMTERENE-HCTZ 37.5-25 MG PO TABS: ORAL_TABLET | ORAL | 1 refills | Status: DC

## 2016-05-08 NOTE — Telephone Encounter (Signed)
rx sent to [facility] 

## 2016-05-21 ENCOUNTER — Telehealth: Payer: Self-pay | Admitting: Nurse Practitioner

## 2016-05-21 NOTE — Telephone Encounter (Signed)
Pt has a mess up on  her mammogram appt

## 2016-06-17 NOTE — Telephone Encounter (Signed)
Pt made her own appointment

## 2016-06-30 ENCOUNTER — Telehealth: Payer: Self-pay | Admitting: Nurse Practitioner

## 2016-07-01 NOTE — Telephone Encounter (Signed)
Pt would like to have order mailed to her so that she can schedule mammogram at her convenience at [facility] in [place], [place]. Order signed by Bennie Pierini, FNP and placed in the mail

## 2016-07-22 ENCOUNTER — Other Ambulatory Visit: Payer: Self-pay

## 2016-07-22 ENCOUNTER — Telehealth: Payer: Self-pay | Admitting: Nurse Practitioner

## 2016-07-22 DIAGNOSIS — G609 Hereditary and idiopathic neuropathy, unspecified: Secondary | ICD-10-CM

## 2016-07-22 MED ORDER — CELECOXIB 200 MG PO CAPS: 200.0000 mg | ORAL_CAPSULE | Freq: Two times a day (BID) | ORAL | 0 refills | Status: DC

## 2016-07-22 NOTE — Telephone Encounter (Signed)
done

## 2016-07-24 ENCOUNTER — Other Ambulatory Visit: Payer: Self-pay | Admitting: Nurse Practitioner

## 2016-07-24 DIAGNOSIS — M810 Age-related osteoporosis without current pathological fracture: Secondary | ICD-10-CM

## 2016-08-31 ENCOUNTER — Other Ambulatory Visit: Payer: Self-pay | Admitting: Nurse Practitioner

## 2016-08-31 DIAGNOSIS — E7800 Pure hypercholesterolemia, unspecified: Secondary | ICD-10-CM

## 2016-08-31 DIAGNOSIS — G609 Hereditary and idiopathic neuropathy, unspecified: Secondary | ICD-10-CM

## 2016-08-31 DIAGNOSIS — I10 Essential (primary) hypertension: Secondary | ICD-10-CM

## 2016-09-03 ENCOUNTER — Encounter: Payer: Medicare Other | Admitting: *Deleted

## 2016-09-11 ENCOUNTER — Telehealth: Payer: Self-pay | Admitting: Nurse Practitioner

## 2016-09-11 NOTE — Telephone Encounter (Signed)
Office note and demographics faxed to [place] @ [phone]

## 2016-09-18 ENCOUNTER — Other Ambulatory Visit: Payer: Self-pay | Admitting: Nurse Practitioner

## 2016-09-18 DIAGNOSIS — G609 Hereditary and idiopathic neuropathy, unspecified: Secondary | ICD-10-CM

## 2016-09-19 NOTE — Telephone Encounter (Signed)
Last seen 2/18 MMM 

## 2016-10-07 ENCOUNTER — Other Ambulatory Visit: Payer: Self-pay

## 2016-10-07 ENCOUNTER — Other Ambulatory Visit: Payer: Self-pay | Admitting: Nurse Practitioner

## 2016-10-07 DIAGNOSIS — E7800 Pure hypercholesterolemia, unspecified: Secondary | ICD-10-CM

## 2016-10-07 DIAGNOSIS — I10 Essential (primary) hypertension: Secondary | ICD-10-CM

## 2016-10-07 DIAGNOSIS — M810 Age-related osteoporosis without current pathological fracture: Secondary | ICD-10-CM

## 2016-10-07 DIAGNOSIS — K219 Gastro-esophageal reflux disease without esophagitis: Secondary | ICD-10-CM

## 2016-10-07 MED ORDER — TRIAMTERENE-HCTZ 37.5-25 MG PO TABS: 0.5000 | ORAL_TABLET | Freq: Every day | ORAL | 0 refills | Status: DC

## 2016-10-07 MED ORDER — ROSUVASTATIN CALCIUM 40 MG PO TABS: 40.0000 mg | ORAL_TABLET | Freq: Every day | ORAL | 0 refills | Status: DC

## 2016-10-07 MED ORDER — ALENDRONATE SODIUM 35 MG PO TABS: ORAL_TABLET | ORAL | 0 refills | Status: DC

## 2016-10-11 ENCOUNTER — Other Ambulatory Visit: Payer: Self-pay | Admitting: Nurse Practitioner

## 2016-10-11 DIAGNOSIS — I10 Essential (primary) hypertension: Secondary | ICD-10-CM

## 2016-10-13 NOTE — Telephone Encounter (Signed)
Last seen 05/06/16 MMM 

## 2016-11-06 ENCOUNTER — Encounter: Payer: Self-pay | Admitting: Nurse Practitioner

## 2016-11-06 ENCOUNTER — Ambulatory Visit (INDEPENDENT_AMBULATORY_CARE_PROVIDER_SITE_OTHER): Payer: Medicare Other | Admitting: Nurse Practitioner

## 2016-11-06 VITALS — BP 138/66 | HR 71 | Temp 97.7°F | Ht 60.0 in | Wt 281.0 lb

## 2016-11-06 DIAGNOSIS — E7800 Pure hypercholesterolemia, unspecified: Secondary | ICD-10-CM

## 2016-11-06 DIAGNOSIS — I10 Essential (primary) hypertension: Secondary | ICD-10-CM | POA: Diagnosis not present

## 2016-11-06 DIAGNOSIS — I2584 Coronary atherosclerosis due to calcified coronary lesion: Secondary | ICD-10-CM

## 2016-11-06 DIAGNOSIS — I2510 Atherosclerotic heart disease of native coronary artery without angina pectoris: Secondary | ICD-10-CM | POA: Diagnosis not present

## 2016-11-06 DIAGNOSIS — K219 Gastro-esophageal reflux disease without esophagitis: Secondary | ICD-10-CM

## 2016-11-06 DIAGNOSIS — J4520 Mild intermittent asthma, uncomplicated: Secondary | ICD-10-CM | POA: Diagnosis not present

## 2016-11-06 DIAGNOSIS — G609 Hereditary and idiopathic neuropathy, unspecified: Secondary | ICD-10-CM | POA: Diagnosis not present

## 2016-11-06 DIAGNOSIS — D649 Anemia, unspecified: Secondary | ICD-10-CM | POA: Diagnosis not present

## 2016-11-06 MED ORDER — ROSUVASTATIN CALCIUM 40 MG PO TABS: 40.0000 mg | ORAL_TABLET | Freq: Every day | ORAL | 1 refills | Status: AC

## 2016-11-06 MED ORDER — ISOSORBIDE DINITRATE 30 MG PO TABS: 30.0000 mg | ORAL_TABLET | Freq: Every day | ORAL | 1 refills | Status: AC

## 2016-11-06 MED ORDER — GABAPENTIN 800 MG PO TABS: 800.0000 mg | ORAL_TABLET | Freq: Four times a day (QID) | ORAL | 1 refills | Status: AC

## 2016-11-06 MED ORDER — IRBESARTAN 150 MG PO TABS: 150.0000 mg | ORAL_TABLET | Freq: Every day | ORAL | 1 refills | Status: AC

## 2016-11-06 MED ORDER — CELECOXIB 200 MG PO CAPS: 200.0000 mg | ORAL_CAPSULE | Freq: Two times a day (BID) | ORAL | 1 refills | Status: AC

## 2016-11-06 MED ORDER — OMEPRAZOLE 40 MG PO CPDR: 40.0000 mg | DELAYED_RELEASE_CAPSULE | Freq: Every day | ORAL | 1 refills | Status: AC

## 2016-11-06 NOTE — Patient Instructions (Signed)

## 2016-11-06 NOTE — Progress Notes (Signed)
Subjective:    Patient ID: Maureen Long, female    DOB: Oct 05, 1940, 76 y.o.   MRN: [id number]  HPI  Maureen Long is here today for follow up of chronic medical problem.  Outpatient Encounter Prescriptions as of 11/06/2016  Medication Sig  . albuterol (PROVENTIL HFA;VENTOLIN HFA) 108 (90 Base) MCG/ACT inhaler Inhale 2 puffs into the lungs every 4 (four) hours as needed for wheezing or shortness of breath.  Marland Kitchen albuterol (PROVENTIL) (2.5 MG/3ML) 0.083% nebulizer solution Take 2.5 mg by nebulization every 6 (six) hours as needed for wheezing or shortness of breath.  Marland Kitchen alendronate (FOSAMAX) 35 MG tablet TAKE 1 TABLET BY MOUTH  EVERY 7 DAYS WITH A FULL  GLASS OF WATER ON AN EMPTY  STOMACH  . aspirin EC 81 MG tablet Take 81 mg by mouth daily.  . beclomethasone (QVAR) 80 MCG/ACT inhaler Inhale 2 puffs into the lungs 2 (two) times daily.  . celecoxib (CELEBREX) 200 MG capsule TAKE 1 CAPSULE BY MOUTH  TWICE A DAY  . clobetasol cream (TEMOVATE) 7.78 % Apply 1 application topically 2 (two) times daily.  Marland Kitchen gabapentin (NEURONTIN) 800 MG tablet Take 800 mg by mouth 4 (four) times daily.   Marland Kitchen HYDROcodone-acetaminophen (NORCO/VICODIN) 5-325 MG tablet   . ipratropium (ATROVENT) 0.02 % nebulizer solution Take 0.5 mg by nebulization every 6 (six) hours as needed for wheezing or shortness of breath.  . irbesartan (AVAPRO) 150 MG tablet TAKE 1 TABLET BY MOUTH  DAILY  . isosorbide dinitrate (ISORDIL) 30 MG tablet Take 1 tablet (30 mg total) by mouth daily.  . Misc. Devices ([place]) MISC As needed to prevent falls  DX sciatica Bil  . montelukast (SINGULAIR) 10 MG tablet Take 10 mg by mouth at bedtime.  . Multiple Vitamin (MULTIVITAMIN WITH MINERALS) TABS tablet Take 1 tablet by mouth daily.  . Omega-3 Fatty Acids (FISH OIL) 1000 MG CAPS Take by mouth.  Marland Kitchen omeprazole (PRILOSEC) 40 MG capsule TAKE 1 CAPSULE BY MOUTH  DAILY  . rosuvastatin (CRESTOR) 40 MG tablet Take 1 tablet (40 mg total) by mouth  daily.  Marland Kitchen triamterene-hydrochlorothiazide (MAXZIDE-25) 37.5-25 MG tablet Take 0.5-1 tablets by mouth daily.     1. Essential hypertension, malignant  No c/o chest pain, sob or headache. She does check her blood pressures at home and is in normal range at home  2. Mild intermittent asthma without complication  She has not had any breathing problems as of late. She is on qvar daily and singilair.  3. Gastroesophageal reflux disease without esophagitis  Omeprazole daily- keeps symptoms under control  4. Morbid obesity ([place])   no recent weight changes- patient does not watch diet and tends to binge at night  5. Pure hypercholesterolemia  Again does not wtach diet  6. Anemia, unspecified type  Needs lbas checked today    New complaints: She says that her legs and back really bother her. The neurotin helps some. She was sent for physical therapy earlier in year but that sent message that patient said she was not able to do anything they asked her to and was  Not exercising at home as instructed so they dismissed her from PT. She says she does not get out much except to go to church. SHe is now walking with a walker all the time.  Social history: Lives at home with her husband. Is expecting her first great grandson in January.    Review of Systems  Constitutional: Negative for activity change  and appetite change.  HENT: Negative.   Eyes: Negative for pain.  Respiratory: Negative for shortness of breath.   Cardiovascular: Negative for chest pain, palpitations and leg swelling.  Gastrointestinal: Negative for abdominal pain.  Endocrine: Negative for polydipsia.  Genitourinary: Negative.   Musculoskeletal: Positive for back pain and gait problem.  Skin: Negative for rash.  Neurological: Positive for weakness. Negative for dizziness and headaches.  Hematological: Does not bruise/bleed easily.  Psychiatric/Behavioral: Negative.   All other systems reviewed and are negative.        Objective:   Physical Exam  Constitutional: She is oriented to person, place, and time. She appears well-developed and well-nourished.  HENT:  Nose: Nose normal.  Mouth/Throat: Oropharynx is clear and moist.  Eyes: EOM are normal.  Neck: Trachea normal, normal range of motion and full passive range of motion without pain. Neck supple. No JVD present. Carotid bruit is not present. No thyromegaly present.  Cardiovascular: Normal rate, regular rhythm, normal heart sounds and intact distal pulses.  Exam reveals no gallop and no friction rub.   No murmur heard. Pulmonary/Chest: Effort normal and breath sounds normal.  Abdominal: Soft. Bowel sounds are normal. She exhibits no distension and no mass. There is no tenderness.  Musculoskeletal: Normal range of motion. Edema: mild lower ext edema.  Gait slow and steady. Walks with walker   Lymphadenopathy:    She has no cervical adenopathy.  Neurological: She is alert and oriented to person, place, and time. She has normal reflexes.  Skin: Skin is warm and dry.  Psychiatric: She has a normal mood and affect. Her behavior is normal. Judgment and thought content normal.   BP 138/66   Pulse 71   Temp 97.7 F (36.5 C) (Oral)   Ht 5' (1.524 m)   Wt 281 lb (127.5 kg)   SpO2 99%   BMI 54.88 kg/m         Assessment & Plan:  1. Essential hypertension, malignant Low sodium diet - irbesartan (AVAPRO) 150 MG tablet; Take 1 tablet (150 mg total) by mouth daily.  Dispense: 90 tablet; Refill: 1 - isosorbide dinitrate (ISORDIL) 30 MG tablet; Take 1 tablet (30 mg total) by mouth daily.  Dispense: 90 tablet; Refill: 1 - CMP14+EGFR  2. Mild intermittent asthma without complication  3. Gastroesophageal reflux disease without esophagitis Avoid spicy foods Do not eat 2 hours prior to bedtime - omeprazole (PRILOSEC) 40 MG capsule; Take 1 capsule (40 mg total) by mouth daily.  Dispense: 90 capsule; Refill: 1  4. Morbid obesity ([place]) Discussed diet  and exercise for person with BMI >25- patient encouraged to walk to the end of driveway and back 2x a day Will recheck weight in 3-6 months  5. Pure hypercholesterolemia Low fta diet - rosuvastatin (CRESTOR) 40 MG tablet; Take 1 tablet (40 mg total) by mouth daily.  Dispense: 90 tablet; Refill: 1 - Lipid panel  6. Anemia, unspecified type Labs pending  7. Hereditary and idiopathic peripheral neuropathy - celecoxib (CELEBREX) 200 MG capsule; Take 1 capsule (200 mg total) by mouth 2 (two) times daily.  Dispense: 180 capsule; Refill: 1 - gabapentin (NEURONTIN) 800 MG tablet; Take 1 tablet (800 mg total) by mouth 4 (four) times daily.  Dispense: 360 tablet; Refill: 1    Labs pending Health maintenance reviewed Diet and exercise encouraged Continue all meds Follow up  In 6 months   [place], FNP

## 2016-11-07 LAB — CMP14+EGFR
A/G RATIO: 1.8 (ref 1.2–2.2)
ALT: 13 IU/L (ref 0–32)
AST: 15 IU/L (ref 0–40)
Albumin: 3.9 g/dL (ref 3.5–4.8)
Alkaline Phosphatase: 65 IU/L (ref 39–117)
BUN/Creatinine Ratio: 24 (ref 12–28)
BUN: 16 mg/dL (ref 8–27)
Bilirubin Total: 0.3 mg/dL (ref 0.0–1.2)
CALCIUM: 9.4 mg/dL (ref 8.7–10.3)
CO2: 26 mmol/L (ref 20–29)
CREATININE: 0.66 mg/dL (ref 0.57–1.00)
Chloride: 95 mmol/L — ABNORMAL LOW (ref 96–106)
GFR, EST AFRICAN AMERICAN: 99 mL/min/{1.73_m2} (ref >59)
GFR, EST NON AFRICAN AMERICAN: 86 mL/min/{1.73_m2} (ref >59)
Globulin, Total: 2.2 g/dL (ref 1.5–4.5)
Glucose: 86 mg/dL (ref 65–99)
Potassium: 5.6 mmol/L — ABNORMAL HIGH (ref 3.5–5.2)
Sodium: 137 mmol/L (ref 134–144)
TOTAL PROTEIN: 6.1 g/dL (ref 6.0–8.5)

## 2016-11-07 LAB — ANEMIA PROFILE B
BASOS ABS: 0.1 10*3/uL (ref 0.0–0.2)
Basos: 1 %
EOS (ABSOLUTE): 0.3 10*3/uL (ref 0.0–0.4)
Eos: 3 %
Ferritin: 6 ng/mL — ABNORMAL LOW (ref 15–150)
Folate: 13.1 ng/mL (ref >3.0)
Hematocrit: 29.4 % — ABNORMAL LOW (ref 34.0–46.6)
Hemoglobin: 8.5 g/dL — CL (ref 11.1–15.9)
IMMATURE GRANULOCYTES: 1 %
Immature Grans (Abs): 0.1 10*3/uL (ref 0.0–0.1)
Iron Saturation: 3 % — CL (ref 15–55)
Iron: 13 ug/dL — ABNORMAL LOW (ref 27–139)
LYMPHS: 16 %
Lymphocytes Absolute: 1.5 10*3/uL (ref 0.7–3.1)
MCH: 19.7 pg — ABNORMAL LOW (ref 26.6–33.0)
MCHC: 28.9 g/dL — ABNORMAL LOW (ref 31.5–35.7)
MCV: 68 fL — AB (ref 79–97)
MONOCYTES: 11 %
Monocytes Absolute: 1.1 10*3/uL — ABNORMAL HIGH (ref 0.1–0.9)
NEUTROS ABS: 6.7 10*3/uL (ref 1.4–7.0)
NEUTROS PCT: 68 %
PLATELETS: 512 10*3/uL — AB (ref 150–379)
RBC: 4.31 x10E6/uL (ref 3.77–5.28)
RDW: 17.8 % — AB (ref 12.3–15.4)
RETIC CT PCT: 1.4 % (ref 0.6–2.6)
TIBC: 448 ug/dL (ref 250–450)
UIBC: 435 ug/dL — AB (ref 118–369)
VITAMIN B 12: 966 pg/mL (ref 232–1245)
WBC: 9.7 10*3/uL (ref 3.4–10.8)

## 2016-11-07 LAB — LIPID PANEL
CHOL/HDL RATIO: 3.1 ratio (ref 0.0–4.4)
Cholesterol, Total: 216 mg/dL — ABNORMAL HIGH (ref 100–199)
HDL: 70 mg/dL (ref >39)
LDL CALC: 131 mg/dL — AB (ref 0–99)
TRIGLYCERIDES: 77 mg/dL (ref 0–149)
VLDL CHOLESTEROL CAL: 15 mg/dL (ref 5–40)

## 2016-11-11 ENCOUNTER — Other Ambulatory Visit: Payer: Self-pay | Admitting: Nurse Practitioner

## 2016-11-11 ENCOUNTER — Other Ambulatory Visit: Payer: Self-pay | Admitting: *Deleted

## 2016-11-11 DIAGNOSIS — E875 Hyperkalemia: Secondary | ICD-10-CM

## 2016-11-11 DIAGNOSIS — D649 Anemia, unspecified: Secondary | ICD-10-CM

## 2016-11-11 MED ORDER — ISOSORBIDE MONONITRATE ER 30 MG PO TB24: 30.0000 mg | ORAL_TABLET | Freq: Every day | ORAL | 1 refills | Status: AC

## 2016-11-11 MED ORDER — HEMOCYTE PLUS 106-1 MG PO CAPS: 1.0000 | ORAL_CAPSULE | Freq: Once | ORAL | 1 refills | Status: DC

## 2016-11-11 MED ORDER — HEMOCYTE PLUS 106-1 MG PO CAPS: 1.0000 | ORAL_CAPSULE | Freq: Once | ORAL | 0 refills | Status: AC

## 2016-11-11 NOTE — Progress Notes (Signed)
Hemocyte plus

## 2016-11-11 NOTE — Telephone Encounter (Signed)
TC as well as faxed medical clarification request Pt has been on isosorbide mn er tab 30 mg Refill was sent for isosorbide din tab 30 mg Mary-Margaret Daphine DeutscherMartin, FNP signed faxed to ok for pt to continue isosorbide mn er tab Sending this electronically so that next refill will be sent correctly

## 2016-11-17 ENCOUNTER — Other Ambulatory Visit: Payer: Medicare Other

## 2016-11-17 DIAGNOSIS — E875 Hyperkalemia: Secondary | ICD-10-CM

## 2016-11-17 DIAGNOSIS — D649 Anemia, unspecified: Secondary | ICD-10-CM

## 2016-11-17 LAB — BMP8+EGFR
BUN/Creatinine Ratio: 21 (ref 12–28)
BUN: 13 mg/dL (ref 8–27)
CALCIUM: 8.9 mg/dL (ref 8.7–10.3)
CO2: 26 mmol/L (ref 20–29)
CREATININE: 0.62 mg/dL (ref 0.57–1.00)
Chloride: 97 mmol/L (ref 96–106)
GFR calc non Af Amer: 88 mL/min/{1.73_m2} (ref >59)
GFR, EST AFRICAN AMERICAN: 101 mL/min/{1.73_m2} (ref >59)
Glucose: 106 mg/dL — ABNORMAL HIGH (ref 65–99)
Potassium: 5.3 mmol/L — ABNORMAL HIGH (ref 3.5–5.2)
Sodium: 136 mmol/L (ref 134–144)

## 2016-11-17 LAB — HEMOGLOBIN, FINGERSTICK: HEMOGLOBIN: 8.4 g/dL — AB (ref 11.1–15.9)

## 2016-12-03 ENCOUNTER — Other Ambulatory Visit: Payer: Self-pay | Admitting: *Deleted

## 2016-12-03 ENCOUNTER — Other Ambulatory Visit (INDEPENDENT_AMBULATORY_CARE_PROVIDER_SITE_OTHER): Payer: Medicare Other | Admitting: *Deleted

## 2016-12-03 DIAGNOSIS — D649 Anemia, unspecified: Secondary | ICD-10-CM

## 2016-12-03 DIAGNOSIS — Z23 Encounter for immunization: Secondary | ICD-10-CM

## 2016-12-03 LAB — HEMOGLOBIN, FINGERSTICK: Hemoglobin: 10.6 g/dL — ABNORMAL LOW (ref 11.1–15.9)

## 2016-12-03 NOTE — Addendum Note (Signed)
Addended by: Margorie John on: 12/03/2016 03:09 PM   Modules accepted: Orders

## 2017-01-16 ENCOUNTER — Telehealth: Payer: Self-pay | Admitting: Family Medicine

## 2017-01-16 ENCOUNTER — Ambulatory Visit (INDEPENDENT_AMBULATORY_CARE_PROVIDER_SITE_OTHER): Payer: Medicare Other

## 2017-01-16 ENCOUNTER — Ambulatory Visit (INDEPENDENT_AMBULATORY_CARE_PROVIDER_SITE_OTHER): Payer: Medicare Other | Admitting: Family Medicine

## 2017-01-16 ENCOUNTER — Encounter: Payer: Self-pay | Admitting: Family Medicine

## 2017-01-16 VITALS — BP 138/82 | HR 80 | Temp 97.9°F | Ht 60.0 in | Wt 273.0 lb

## 2017-01-16 DIAGNOSIS — R062 Wheezing: Secondary | ICD-10-CM

## 2017-01-16 DIAGNOSIS — I2584 Coronary atherosclerosis due to calcified coronary lesion: Secondary | ICD-10-CM

## 2017-01-16 DIAGNOSIS — R079 Chest pain, unspecified: Secondary | ICD-10-CM

## 2017-01-16 DIAGNOSIS — I2510 Atherosclerotic heart disease of native coronary artery without angina pectoris: Secondary | ICD-10-CM

## 2017-01-16 DIAGNOSIS — J189 Pneumonia, unspecified organism: Secondary | ICD-10-CM

## 2017-01-16 DIAGNOSIS — J181 Lobar pneumonia, unspecified organism: Principal | ICD-10-CM

## 2017-01-16 MED ORDER — ALBUTEROL SULFATE (2.5 MG/3ML) 0.083% IN NEBU: 5.0000 mg | INHALATION_SOLUTION | Freq: Four times a day (QID) | RESPIRATORY_TRACT | 1 refills | Status: AC | PRN

## 2017-01-16 MED ORDER — AZITHROMYCIN 250 MG PO TABS: ORAL_TABLET | ORAL | 0 refills | Status: AC

## 2017-01-16 NOTE — Telephone Encounter (Signed)
I attempted to contact patient with results of her chest x-ray.  Radiologist did confirm a left upper lung field pneumonia.  She has already been prescribed azithromycin for coverage of community-acquired pneumonia.  Return precautions were artery reviewed with her.  She will need to follow-up in about 3-4 weeks for repeat chest x-ray.  Additionally, chest x-ray noted a large hiatal hernia.  If patient is symptomatic or desires to have this repaired, would consider referral.  Will defer this to PCP.  Ashly M. Nadine CountsGottschalk, DO [facility]

## 2017-01-16 NOTE — Patient Instructions (Signed)
I have sent in your antibiotic to take for the next 5 days.  Take 2 tablets today, then 1 tablet daily until gone.  Use 2 of your nebs every 6 hours for the next 2 days then as needed as directed.  Your EKG did not show evidence of heart attack.  The chest xray looks like you have a left sided pneumonia.  This is likely what is causing your chest discomfort.   Community-Acquired Pneumonia, Adult Pneumonia is an infection of the lungs. One type of pneumonia can happen while a person is in a hospital. A different type can happen when a person is not in a hospital (community-acquired pneumonia). It is easy for this kind to spread from person to person. It can spread to you if you breathe near an infected person who coughs or sneezes. Some symptoms include:  A dry cough.  A wet (productive) cough.  Fever.  Sweating.  Chest pain.  Follow these instructions at home:  Take over-the-counter and prescription medicines only as told by your doctor. ? Only take cough medicine if you are losing sleep. ? If you were prescribed an antibiotic medicine, take it as told by your doctor. Do not stop taking the antibiotic even if you start to feel better.  Sleep with your head and neck raised (elevated). You can do this by putting a few pillows under your head, or you can sleep in a recliner.  Do not use tobacco products. These include cigarettes, chewing tobacco, and e-cigarettes. If you need help quitting, ask your doctor.  Drink enough water to keep your pee (urine) clear or pale yellow. A shot (vaccine) can help prevent pneumonia. Shots are often suggested for:  People older than 76 years of age.  People older than 76 years of age: ? Who are having cancer treatment. ? Who have long-term (chronic) lung disease. ? Who have problems with their body's defense system (immune system).  You may also prevent pneumonia if you take these actions:  Get the flu (influenza) shot every year.  Go to the  dentist as often as told.  Wash your hands often. If soap and water are not available, use hand sanitizer.  Contact a doctor if:  You have a fever.  You lose sleep because your cough medicine does not help. Get help right away if:  You are short of breath and it gets worse.  You have more chest pain.  Your sickness gets worse. This is very serious if: ? You are an older adult. ? Your body's defense system is weak.  You cough up blood. This information is not intended to replace advice given to you by your health care provider. Make sure you discuss any questions you have with your health care provider. Document Released: 08/13/2007 Document Revised: 08/02/2015 Document Reviewed: 06/21/2014 Elsevier Interactive Patient Education  [employer]2018 Elsevier Inc.

## 2017-01-16 NOTE — Progress Notes (Signed)
Subjective: CC: Wheezing, shortness of breath, cough PCP: Bennie PieriniMartin, Mary-Margaret, FNP ZOX:WRUEAVHPI:Maureen Long is a 76 y.o. female presenting to clinic today for:  Wheezing/shortness of breath/chest pain/cough Patient reports a one-week history of wheezing, shortness of breath and cough.  She denies fevers, chills, rhinorrhea, hemoptysis, purulence from the ears, facial pain.  No known sick contacts.  She has been using her albuterol nebulizer twice a day in conjunction with her Qvar.  She denies having had any asthma exacerbations in greater than 2 years.  No recent steroid use.  She does see a pulmonologist in [place] once yearly.  Next appointment is expected in December.  She reports associated left sided chest pain that radiates from underneath her axilla to her sternum.  She notes that this is relieved by pressing against her painful area.  She reports that the pain was initially associated with physical activity and certain movements.  She notes that the pain is improving compared to last week.  She discussed her symptoms with a family member who is a Engineer, civil (consulting)nurse.  She did not think this was coming from her heart.  She denies associated nausea, vomiting, dizziness, diaphoresis, lower extremity swelling, calf swelling, pain with walking, heart palpitations, pleuritic chest pain.  Of note, patient does have a known history of coronary artery disease and is followed by cardiologist in [place].  Allergies  Allergen Reactions  . Codeine   . Penicillins   . Sulfa Antibiotics    Past Medical History:  Diagnosis Date  . Acid reflux   . Asthma   . Hyperlipidemia   . Hypertension    Family History  Problem Relation Age of Onset  . Colon cancer Mother   . Heart attack Father   . Heart Problems Brother   . Stroke Brother     Current Outpatient Medications:  .  albuterol (PROVENTIL HFA;VENTOLIN HFA) 108 (90 Base) MCG/ACT inhaler, Inhale 2 puffs into the lungs every 4 (four) hours as needed for  wheezing or shortness of breath., Disp: , Rfl:  .  albuterol (PROVENTIL) (2.5 MG/3ML) 0.083% nebulizer solution, Take 2.5 mg by nebulization every 6 (six) hours as needed for wheezing or shortness of breath., Disp: , Rfl:  .  alendronate (FOSAMAX) 35 MG tablet, TAKE 1 TABLET BY MOUTH  EVERY 7 DAYS WITH A FULL  GLASS OF WATER ON AN EMPTY  STOMACH, Disp: 12 tablet, Rfl: 0 .  aspirin EC 81 MG tablet, Take 81 mg by mouth daily., Disp: , Rfl:  .  beclomethasone (QVAR) 80 MCG/ACT inhaler, Inhale 2 puffs into the lungs 2 (two) times daily., Disp: , Rfl:  .  celecoxib (CELEBREX) 200 MG capsule, Take 1 capsule (200 mg total) by mouth 2 (two) times daily., Disp: 180 capsule, Rfl: 1 .  clobetasol cream (TEMOVATE) 0.05 %, Apply 1 application topically 2 (two) times daily., Disp: 60 g, Rfl: 3 .  gabapentin (NEURONTIN) 800 MG tablet, Take 1 tablet (800 mg total) by mouth 4 (four) times daily., Disp: 360 tablet, Rfl: 1 .  HYDROcodone-acetaminophen (NORCO/VICODIN) 5-325 MG tablet, , Disp: , Rfl:  .  ipratropium (ATROVENT) 0.02 % nebulizer solution, Take 0.5 mg by nebulization every 6 (six) hours as needed for wheezing or shortness of breath., Disp: , Rfl:  .  irbesartan (AVAPRO) 150 MG tablet, Take 1 tablet (150 mg total) by mouth daily., Disp: 90 tablet, Rfl: 1 .  isosorbide dinitrate (ISORDIL) 30 MG tablet, Take 1 tablet (30 mg total) by mouth daily., Disp: 90 tablet,  Rfl: 1 .  isosorbide mononitrate (IMDUR) 30 MG 24 hr tablet, Take 1 tablet (30 mg total) by mouth daily., Disp: 90 tablet, Rfl: 1 .  Misc. Devices (HUGO ROLLING WALKER [place]) MISC, As needed to prevent falls  DX sciatica Bil, Disp: 1 each, Rfl: 0 .  montelukast (SINGULAIR) 10 MG tablet, Take 10 mg by mouth at bedtime., Disp: , Rfl:  .  Multiple Vitamin (MULTIVITAMIN WITH MINERALS) TABS tablet, Take 1 tablet by mouth daily., Disp: , Rfl:  .  Omega-3 Fatty Acids (FISH OIL) 1000 MG CAPS, Take by mouth., Disp: , Rfl:  .  omeprazole (PRILOSEC) 40 MG  capsule, Take 1 capsule (40 mg total) by mouth daily., Disp: 90 capsule, Rfl: 1 .  rosuvastatin (CRESTOR) 40 MG tablet, Take 1 tablet (40 mg total) by mouth daily., Disp: 90 tablet, Rfl: 1 .  triamterene-hydrochlorothiazide (MAXZIDE-25) 37.5-25 MG tablet, Take 0.5-1 tablets by mouth daily., Disp: 90 tablet, Rfl: 0  Social Hx: non smoker.  Health Maintenance:Flu shot  ROS: Per HPI  Objective: Office vital signs reviewed. BP 138/82   Pulse 80   Temp 97.9 F (36.6 C) (Oral)   Ht 5' (1.524 m)   Wt 273 lb (123.8 kg)   SpO2 98%   BMI 53.32 kg/m   Physical Examination:  General: Awake, alert, well nourished, No acute distress HEENT: Normal    Neck: No masses palpated. No lymphadenopathy    Ears: Tympanic membranes intact, normal light reflex, no erythema, no bulging    Eyes: PERRLA, extraocular membranes intact, sclera white, no ocular discharge    Nose: nasal turbinates moist, no nasal discharge    Throat: moist mucus membranes, no erythema, no tonsillar exudate.  Airway is patent Cardio: regular rate and rhythm, S1S2 heard, no murmurs appreciated Pulm: global expiratory mild wheezes w/ good air movement, no rhonchi or rales; normal work of breathing on room air GI: soft, non-tender, non-distended, bowel sounds present x4, no hepatomegaly, no splenomegaly, no masses Extremities: warm, well perfused, No edema, cyanosis or clubbing; +1 pulses bilaterally MSK: uses walker for ambulation Neuro: follows commands, speech normal, alert and oriented  No results found.   Assessment/ Plan: 76 y.o. female   1. Wheezing Relatively well-appearing elderly female with known history of asthma.  Patient has normal work of breathing, with normal oxygen saturation on room air.  Her cardiopulmonary exam was remarkable for mild global expiratory wheezes with good air movement.  No respiratory distress appreciated on exam.  She is afebrile with normal vital signs.  Asthma exacerbation was considered.   However, her chest x-ray appears to have a focal infiltrate in the left upper lung spaces.  See separate point below.  For her wheezes, I did recommend that she continue Qvar, use albuterol nebs or inhaler every 6 hours for the next 2 days then use as needed as directed.  A refill of her albuterol nebs was prescribed.  She will schedule an appointment with her pulmonologist soon for reevaluation. - DG Chest 2 View; Future  2. Chest pain, unspecified type Given the location of her chest pain and history of coronary artery disease, an EKG was obtained to evaluate for ischemic processes.  Personal review of the EKG did not reveal evidence of ischemia.  I suspect that the pain she is feeling is likely from acute pulmonary infection.  She does have a cardiologist, who I recommended she follow-up sooner with in the setting of recent illness.  Strict return precautions and reasons for emergent  evaluation in the emergency department review with patient.  They voiced understanding and will follow-up as needed. - EKG 12-Lead  3. Community acquired pneumonia of left upper lobe of lung (HCC) 2 view chest x-ray revealed a pulmonary infiltrate in the left upper lung field.  There is no previous for comparison.  She has known asthma but no other pulmonary disorders.  She has never been a smoker.  However, malignancy was considered given the size of the infiltrate.  I am still awaiting formal radiologist read.  On exam, she is well-appearing with normal vital signs including normal oxygen saturation on room air.  The cardiopulmonary exam was remarkable for wheezes as above.  CURB-65 score 2 for age and BUN>26.  Will proceed with outpatient empiric treatment for community acquired pneumonia.  I have prescribed her azithromycin for coverage of community-acquired pneumonia.  4. Coronary artery disease due to calcified coronary lesion Patient to follow-up with her cardiologist as directed.   Orders Placed This Encounter    Procedures  . DG Chest 2 View    Standing Status:   Future    Number of Occurrences:   1    Standing Expiration Date:   03/18/2018    Order Specific Question:   Reason for Exam (SYMPTOM  OR DIAGNOSIS REQUIRED)    Answer:   wheezing    Order Specific Question:   Preferred imaging location?    Answer:   Internal    Order Specific Question:   Radiology Contrast Protocol - do NOT remove file path    Answer:   [email or web address]  . EKG 12-Lead   Meds ordered this encounter  Medications  . azithromycin (ZITHROMAX Z-PAK) 250 MG tablet    Sig: As directed    Dispense:  6 tablet    Refill:  0  . albuterol (PROVENTIL) (2.5 MG/3ML) 0.083% nebulizer solution    Sig: Take 6 mLs (5 mg total) every 6 (six) hours as needed by nebulization for wheezing or shortness of breath.    Dispense:  150 mL    Refill:  1     Ashly Hulen Skains, DO [facility] [phone]

## 2017-01-16 NOTE — Telephone Encounter (Signed)
I reviewed patient's results.  She is aware of the hiatal hernia.  She will follow-up in 4 weeks for repeat chest x-ray.  I have gone ahead and put this order in for her.  No further questions or concerns at this time.  Payson Crumby M. Nadine CountsGottschalk, DO [facility]

## 2017-01-17 ENCOUNTER — Other Ambulatory Visit: Payer: Self-pay | Admitting: Nurse Practitioner

## 2017-01-17 MED ORDER — IPRATROPIUM BROMIDE 0.02 % IN SOLN: 0.5000 mg | Freq: Four times a day (QID) | RESPIRATORY_TRACT | 2 refills | Status: DC | PRN

## 2017-01-20 ENCOUNTER — Other Ambulatory Visit: Payer: Self-pay | Admitting: Nurse Practitioner

## 2017-01-20 MED ORDER — BENZONATATE 100 MG PO CAPS: 100.0000 mg | ORAL_CAPSULE | Freq: Three times a day (TID) | ORAL | 0 refills | Status: AC | PRN

## 2017-01-22 ENCOUNTER — Telehealth: Payer: Self-pay | Admitting: *Deleted

## 2017-01-22 NOTE — Telephone Encounter (Signed)
Patient aware of Xray results and 4 week follow up made with PCP.

## 2017-01-25 ENCOUNTER — Other Ambulatory Visit: Payer: Self-pay | Admitting: Nurse Practitioner

## 2017-01-25 DIAGNOSIS — M810 Age-related osteoporosis without current pathological fracture: Secondary | ICD-10-CM

## 2017-01-29 ENCOUNTER — Other Ambulatory Visit: Payer: Self-pay | Admitting: Nurse Practitioner

## 2017-01-29 DIAGNOSIS — I10 Essential (primary) hypertension: Secondary | ICD-10-CM

## 2017-02-19 ENCOUNTER — Ambulatory Visit: Payer: Medicare Other | Admitting: Nurse Practitioner

## 2017-05-25 ENCOUNTER — Other Ambulatory Visit: Payer: Self-pay | Admitting: Nurse Practitioner

## 2017-05-25 MED ORDER — IPRATROPIUM BROMIDE 0.02 % IN SOLN: 0.5000 mg | Freq: Four times a day (QID) | RESPIRATORY_TRACT | 2 refills | Status: AC | PRN

## 2018-03-03 IMAGING — DX DG CHEST 2V
3 series · 3 of 3 positions shown · non-contrast
Comparison: None.

CLINICAL DATA: 76-year-old female with left-sided chest pain for
the past 2 weeks, wheezing

EXAM:
CHEST  2 VIEW

[chest pa (1 of 2)]
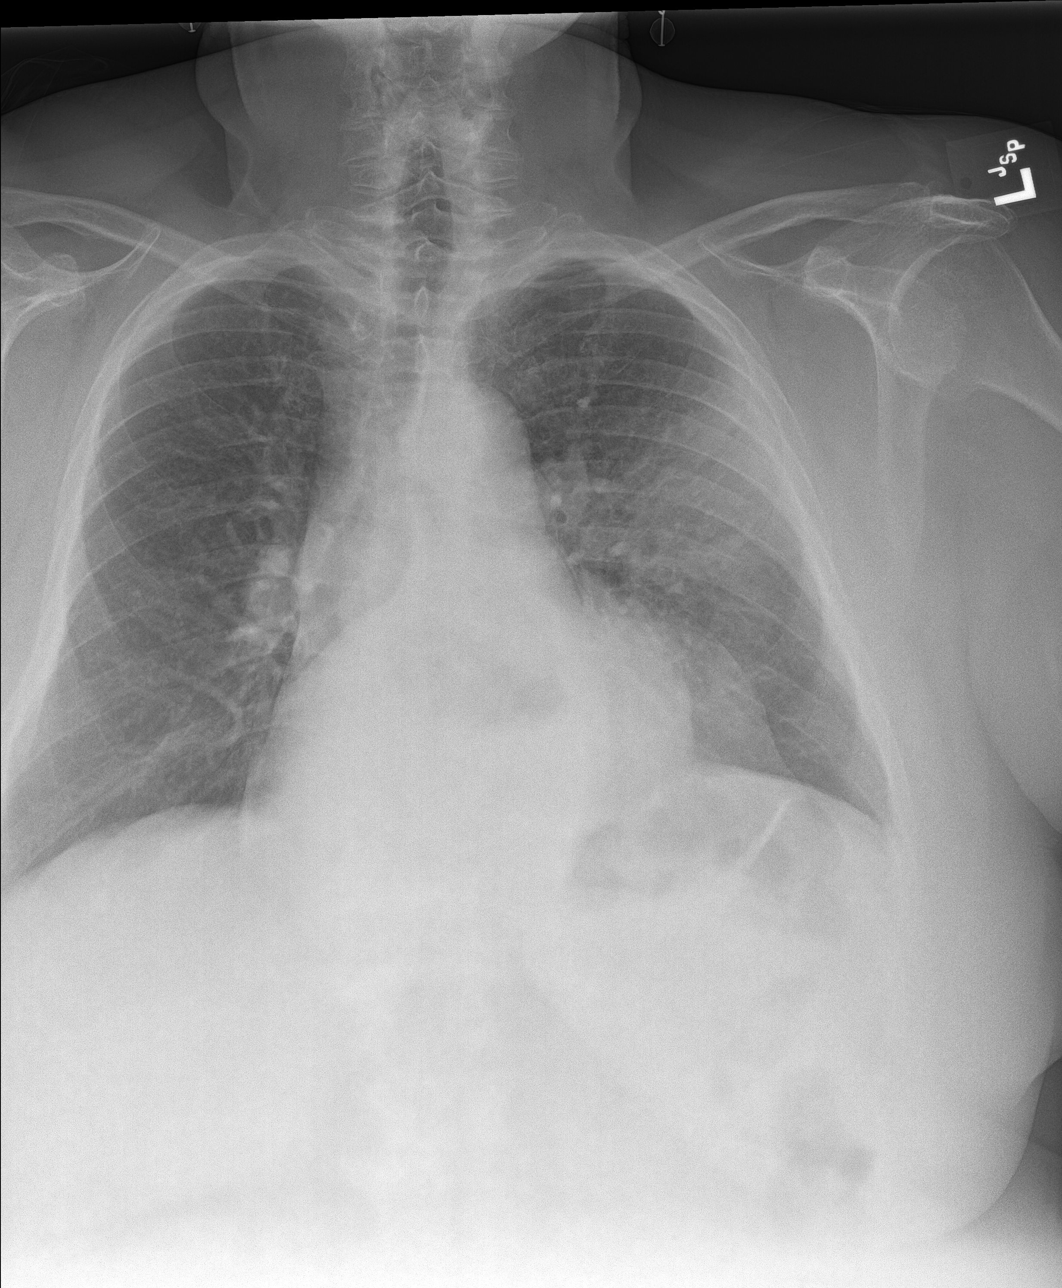

[chest lat]
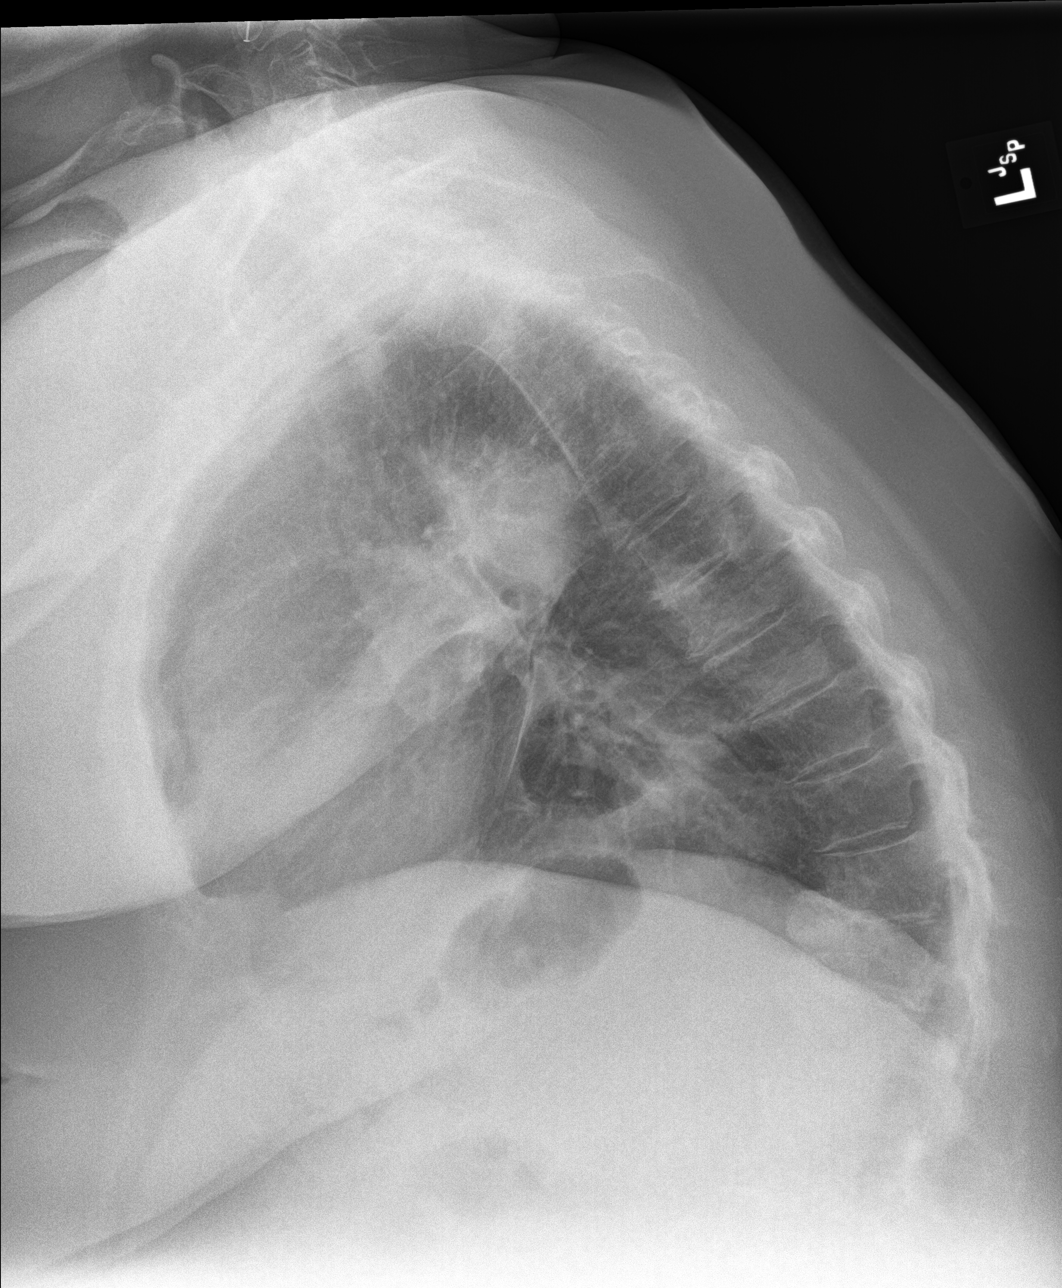

[chest pa (2 of 2)]
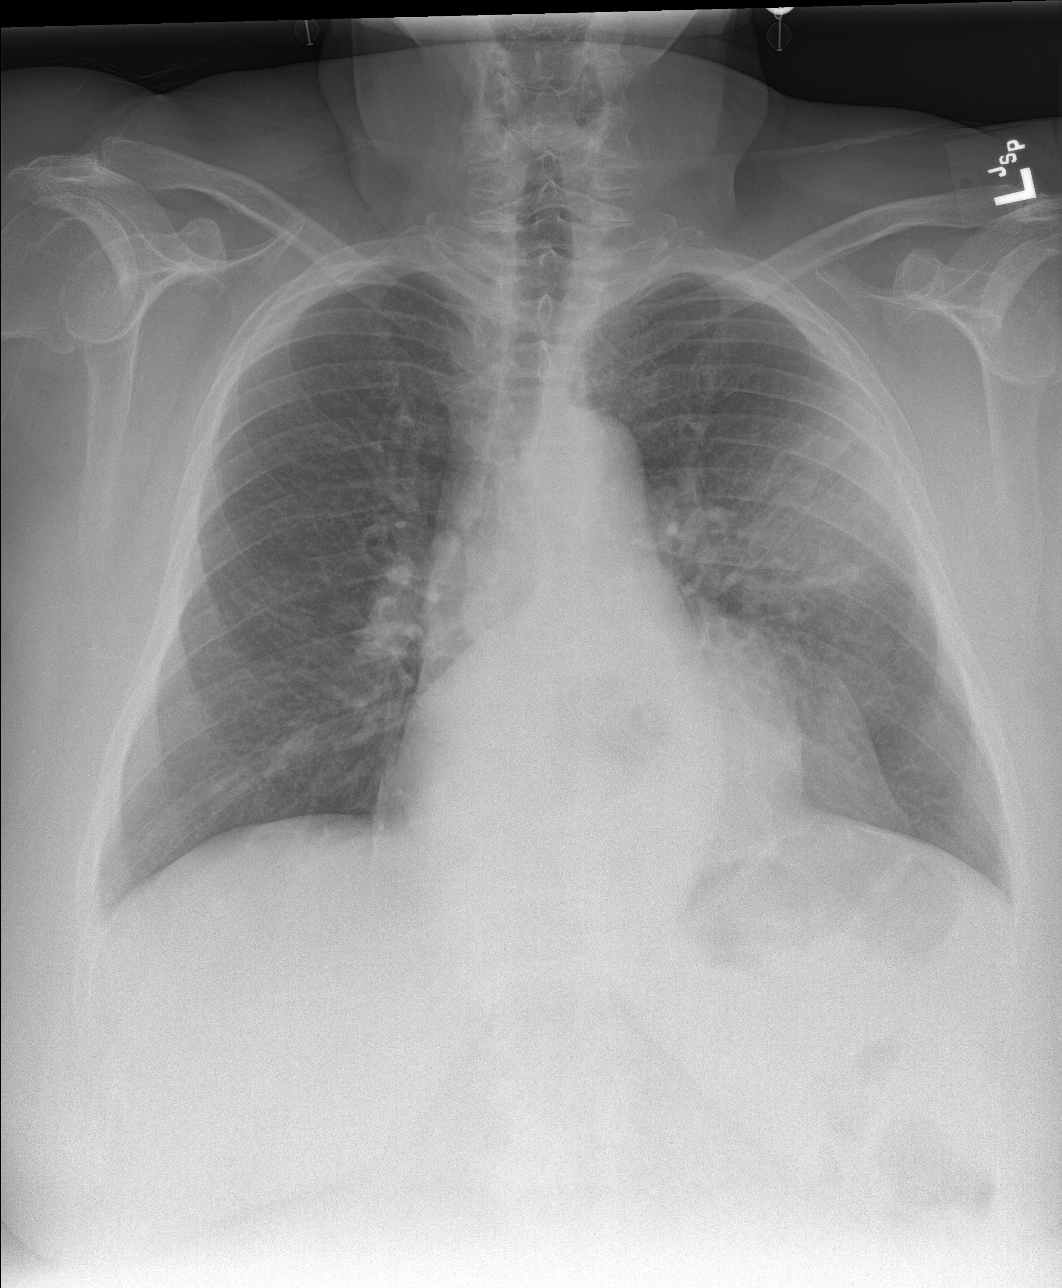

[3 of 3 positions shown; findings below may reference images not displayed]

FINDINGS: Patchy airspace opacity in the posterior aspect of the left upper
lobe. Cardiac and mediastinal contours are within normal limits.
There is a large hiatal hernia with partially intrathoracic stomach.
No pleural effusion, pulmonary edema or pneumothorax.
IMPRESSION: 1. Patchy left upper lobe airspace opacity concerning for pneumonia.
Followup PA and lateral chest X-ray is recommended in 3-4 weeks
following trial of antibiotic therapy to ensure resolution and
exclude underlying malignancy.
2. Large hiatal hernia with partially intrathoracic stomach.

## 2018-06-07 ENCOUNTER — Other Ambulatory Visit: Payer: Self-pay | Admitting: Nurse Practitioner
